# Patient Record
Sex: Male | Born: 1940
Health system: Southern US, Community
[De-identification: ages and names within clinical notes are randomized; demographics above are authoritative.]

## PROBLEM LIST (undated history)

## (undated) DIAGNOSIS — I10 Essential (primary) hypertension: Secondary | ICD-10-CM

## (undated) DIAGNOSIS — G629 Polyneuropathy, unspecified: Secondary | ICD-10-CM

## (undated) DIAGNOSIS — H5440 Blindness, one eye, unspecified eye: Secondary | ICD-10-CM

## (undated) DIAGNOSIS — E785 Hyperlipidemia, unspecified: Secondary | ICD-10-CM

## (undated) DIAGNOSIS — C4490 Unspecified malignant neoplasm of skin, unspecified: Secondary | ICD-10-CM

## (undated) DIAGNOSIS — L409 Psoriasis, unspecified: Secondary | ICD-10-CM

## (undated) HISTORY — DX: Unspecified malignant neoplasm of skin, unspecified: C44.90

## (undated) HISTORY — DX: Psoriasis, unspecified: L40.9

## (undated) HISTORY — DX: Blindness, one eye, unspecified eye: H54.40

## (undated) HISTORY — DX: Polyneuropathy, unspecified: G62.9

## (undated) HISTORY — PX: FACIAL COSMETIC SURGERY: SHX629

## (undated) HISTORY — PX: EYE SURGERY: SHX253

## (undated) HISTORY — DX: Hyperlipidemia, unspecified: E78.5

---

## 2003-06-26 ENCOUNTER — Encounter: Payer: Self-pay | Admitting: Emergency Medicine

## 2003-06-26 ENCOUNTER — Inpatient Hospital Stay (HOSPITAL_COMMUNITY): Admission: EM | Admit: 2003-06-26 | Discharge: 2003-06-28 | Payer: Self-pay | Admitting: Emergency Medicine

## 2003-06-28 ENCOUNTER — Encounter: Payer: Self-pay | Admitting: *Deleted

## 2015-02-25 DIAGNOSIS — H524 Presbyopia: Secondary | ICD-10-CM | POA: Diagnosis not present

## 2015-02-25 DIAGNOSIS — H5210 Myopia, unspecified eye: Secondary | ICD-10-CM | POA: Diagnosis not present

## 2016-03-07 DIAGNOSIS — H5210 Myopia, unspecified eye: Secondary | ICD-10-CM | POA: Diagnosis not present

## 2016-03-07 DIAGNOSIS — H524 Presbyopia: Secondary | ICD-10-CM | POA: Diagnosis not present

## 2017-01-01 DIAGNOSIS — E119 Type 2 diabetes mellitus without complications: Secondary | ICD-10-CM

## 2017-01-01 HISTORY — DX: Type 2 diabetes mellitus without complications: E11.9

## 2017-01-11 DIAGNOSIS — Z79899 Other long term (current) drug therapy: Secondary | ICD-10-CM | POA: Diagnosis not present

## 2017-01-11 DIAGNOSIS — L400 Psoriasis vulgaris: Secondary | ICD-10-CM | POA: Diagnosis not present

## 2017-01-26 DIAGNOSIS — E1165 Type 2 diabetes mellitus with hyperglycemia: Secondary | ICD-10-CM | POA: Diagnosis not present

## 2017-01-26 DIAGNOSIS — Z7984 Long term (current) use of oral hypoglycemic drugs: Secondary | ICD-10-CM | POA: Diagnosis not present

## 2017-02-07 DIAGNOSIS — E119 Type 2 diabetes mellitus without complications: Secondary | ICD-10-CM | POA: Diagnosis not present

## 2017-02-28 DIAGNOSIS — E1165 Type 2 diabetes mellitus with hyperglycemia: Secondary | ICD-10-CM | POA: Diagnosis not present

## 2017-04-04 DIAGNOSIS — Z7984 Long term (current) use of oral hypoglycemic drugs: Secondary | ICD-10-CM | POA: Diagnosis not present

## 2017-04-04 DIAGNOSIS — Z1211 Encounter for screening for malignant neoplasm of colon: Secondary | ICD-10-CM | POA: Diagnosis not present

## 2017-04-04 DIAGNOSIS — Z23 Encounter for immunization: Secondary | ICD-10-CM | POA: Diagnosis not present

## 2017-04-04 DIAGNOSIS — E1165 Type 2 diabetes mellitus with hyperglycemia: Secondary | ICD-10-CM | POA: Diagnosis not present

## 2017-04-04 DIAGNOSIS — Z0000 Encounter for general adult medical examination without abnormal findings: Secondary | ICD-10-CM | POA: Diagnosis not present

## 2017-05-11 DIAGNOSIS — Z7984 Long term (current) use of oral hypoglycemic drugs: Secondary | ICD-10-CM | POA: Diagnosis not present

## 2017-05-11 DIAGNOSIS — E1165 Type 2 diabetes mellitus with hyperglycemia: Secondary | ICD-10-CM | POA: Diagnosis not present

## 2017-05-25 DIAGNOSIS — Z1211 Encounter for screening for malignant neoplasm of colon: Secondary | ICD-10-CM | POA: Diagnosis not present

## 2017-05-31 DIAGNOSIS — E1165 Type 2 diabetes mellitus with hyperglycemia: Secondary | ICD-10-CM | POA: Diagnosis not present

## 2017-05-31 DIAGNOSIS — Z7984 Long term (current) use of oral hypoglycemic drugs: Secondary | ICD-10-CM | POA: Diagnosis not present

## 2017-10-17 DIAGNOSIS — E1165 Type 2 diabetes mellitus with hyperglycemia: Secondary | ICD-10-CM | POA: Diagnosis not present

## 2017-10-17 DIAGNOSIS — R351 Nocturia: Secondary | ICD-10-CM | POA: Diagnosis not present

## 2017-10-17 DIAGNOSIS — N401 Enlarged prostate with lower urinary tract symptoms: Secondary | ICD-10-CM | POA: Diagnosis not present

## 2017-10-17 DIAGNOSIS — E7800 Pure hypercholesterolemia, unspecified: Secondary | ICD-10-CM | POA: Diagnosis not present

## 2017-11-15 DIAGNOSIS — H2513 Age-related nuclear cataract, bilateral: Secondary | ICD-10-CM | POA: Diagnosis not present

## 2017-11-15 DIAGNOSIS — E119 Type 2 diabetes mellitus without complications: Secondary | ICD-10-CM | POA: Diagnosis not present

## 2017-11-15 DIAGNOSIS — H02234 Paralytic lagophthalmos left upper eyelid: Secondary | ICD-10-CM | POA: Diagnosis not present

## 2017-11-15 DIAGNOSIS — Z0100 Encounter for examination of eyes and vision without abnormal findings: Secondary | ICD-10-CM | POA: Diagnosis not present

## 2017-12-29 DIAGNOSIS — R0789 Other chest pain: Secondary | ICD-10-CM | POA: Diagnosis not present

## 2018-01-01 ENCOUNTER — Other Ambulatory Visit: Payer: Self-pay | Admitting: Family Medicine

## 2018-01-01 DIAGNOSIS — R0789 Other chest pain: Secondary | ICD-10-CM

## 2018-01-15 ENCOUNTER — Ambulatory Visit
Admission: RE | Admit: 2018-01-15 | Discharge: 2018-01-15 | Disposition: A | Discharge status: Home / Self Care | Payer: Medicare HMO | Source: Ambulatory Visit | Attending: Family Medicine | Admitting: Family Medicine

## 2018-01-15 DIAGNOSIS — R0789 Other chest pain: Secondary | ICD-10-CM

## 2018-01-15 DIAGNOSIS — R918 Other nonspecific abnormal finding of lung field: Secondary | ICD-10-CM | POA: Diagnosis not present

## 2018-01-15 MED ORDER — IOPAMIDOL (ISOVUE-300) INJECTION 61%
75.0000 mL | Freq: Once | INTRAVENOUS | Status: AC | PRN
Start: 2018-01-15 — End: 2018-01-15
  Administered 2018-01-15: 75 mL via INTRAVENOUS

## 2018-01-23 DIAGNOSIS — R0789 Other chest pain: Secondary | ICD-10-CM | POA: Diagnosis not present

## 2018-01-25 ENCOUNTER — Other Ambulatory Visit: Payer: Self-pay | Admitting: Family Medicine

## 2018-01-25 DIAGNOSIS — R1031 Right lower quadrant pain: Secondary | ICD-10-CM

## 2018-01-25 DIAGNOSIS — R1011 Right upper quadrant pain: Secondary | ICD-10-CM

## 2018-02-08 ENCOUNTER — Ambulatory Visit
Admission: RE | Admit: 2018-02-08 | Discharge: 2018-02-08 | Disposition: A | Discharge status: Home / Self Care | Payer: Medicare HMO | Source: Ambulatory Visit | Attending: Family Medicine | Admitting: Family Medicine

## 2018-02-08 DIAGNOSIS — R1011 Right upper quadrant pain: Secondary | ICD-10-CM

## 2018-02-08 DIAGNOSIS — K8020 Calculus of gallbladder without cholecystitis without obstruction: Secondary | ICD-10-CM | POA: Diagnosis not present

## 2018-02-08 DIAGNOSIS — R1031 Right lower quadrant pain: Secondary | ICD-10-CM

## 2018-02-28 DIAGNOSIS — K8020 Calculus of gallbladder without cholecystitis without obstruction: Secondary | ICD-10-CM | POA: Diagnosis not present

## 2018-03-16 DIAGNOSIS — R109 Unspecified abdominal pain: Secondary | ICD-10-CM | POA: Diagnosis not present

## 2018-04-06 DIAGNOSIS — E1165 Type 2 diabetes mellitus with hyperglycemia: Secondary | ICD-10-CM | POA: Diagnosis not present

## 2018-04-06 DIAGNOSIS — N401 Enlarged prostate with lower urinary tract symptoms: Secondary | ICD-10-CM | POA: Diagnosis not present

## 2018-04-06 DIAGNOSIS — L989 Disorder of the skin and subcutaneous tissue, unspecified: Secondary | ICD-10-CM | POA: Diagnosis not present

## 2018-04-06 DIAGNOSIS — Z0000 Encounter for general adult medical examination without abnormal findings: Secondary | ICD-10-CM | POA: Diagnosis not present

## 2018-04-06 DIAGNOSIS — Z23 Encounter for immunization: Secondary | ICD-10-CM | POA: Diagnosis not present

## 2018-04-06 DIAGNOSIS — E7800 Pure hypercholesterolemia, unspecified: Secondary | ICD-10-CM | POA: Diagnosis not present

## 2018-04-06 DIAGNOSIS — R0789 Other chest pain: Secondary | ICD-10-CM | POA: Diagnosis not present

## 2018-04-06 DIAGNOSIS — Z1211 Encounter for screening for malignant neoplasm of colon: Secondary | ICD-10-CM | POA: Diagnosis not present

## 2018-06-06 DIAGNOSIS — R399 Unspecified symptoms and signs involving the genitourinary system: Secondary | ICD-10-CM | POA: Diagnosis not present

## 2018-06-06 DIAGNOSIS — R319 Hematuria, unspecified: Secondary | ICD-10-CM | POA: Diagnosis not present

## 2018-06-06 DIAGNOSIS — N390 Urinary tract infection, site not specified: Secondary | ICD-10-CM | POA: Diagnosis not present

## 2018-06-12 DIAGNOSIS — C44321 Squamous cell carcinoma of skin of nose: Secondary | ICD-10-CM | POA: Diagnosis not present

## 2018-06-12 DIAGNOSIS — L570 Actinic keratosis: Secondary | ICD-10-CM | POA: Diagnosis not present

## 2018-06-12 DIAGNOSIS — X32XXXA Exposure to sunlight, initial encounter: Secondary | ICD-10-CM | POA: Diagnosis not present

## 2018-07-05 DIAGNOSIS — N401 Enlarged prostate with lower urinary tract symptoms: Secondary | ICD-10-CM | POA: Diagnosis not present

## 2018-07-05 DIAGNOSIS — E7800 Pure hypercholesterolemia, unspecified: Secondary | ICD-10-CM | POA: Diagnosis not present

## 2018-07-05 DIAGNOSIS — E1165 Type 2 diabetes mellitus with hyperglycemia: Secondary | ICD-10-CM | POA: Diagnosis not present

## 2018-07-10 DIAGNOSIS — Z08 Encounter for follow-up examination after completed treatment for malignant neoplasm: Secondary | ICD-10-CM | POA: Diagnosis not present

## 2018-07-10 DIAGNOSIS — Z85828 Personal history of other malignant neoplasm of skin: Secondary | ICD-10-CM | POA: Diagnosis not present

## 2019-04-11 ENCOUNTER — Other Ambulatory Visit: Payer: Self-pay | Admitting: Family Medicine

## 2019-04-11 DIAGNOSIS — R911 Solitary pulmonary nodule: Secondary | ICD-10-CM

## 2019-05-01 ENCOUNTER — Other Ambulatory Visit: Payer: Self-pay

## 2019-05-01 ENCOUNTER — Ambulatory Visit
Admission: RE | Admit: 2019-05-01 | Discharge: 2019-05-01 | Disposition: A | Discharge status: Home / Self Care | Payer: Medicare HMO | Source: Ambulatory Visit | Attending: Family Medicine | Admitting: Family Medicine

## 2019-05-01 DIAGNOSIS — R911 Solitary pulmonary nodule: Secondary | ICD-10-CM

## 2019-05-01 DIAGNOSIS — R918 Other nonspecific abnormal finding of lung field: Secondary | ICD-10-CM | POA: Diagnosis not present

## 2019-05-15 DIAGNOSIS — N401 Enlarged prostate with lower urinary tract symptoms: Secondary | ICD-10-CM | POA: Diagnosis not present

## 2019-05-15 DIAGNOSIS — Z7984 Long term (current) use of oral hypoglycemic drugs: Secondary | ICD-10-CM | POA: Diagnosis not present

## 2019-05-15 DIAGNOSIS — Z0000 Encounter for general adult medical examination without abnormal findings: Secondary | ICD-10-CM | POA: Diagnosis not present

## 2019-05-15 DIAGNOSIS — E1165 Type 2 diabetes mellitus with hyperglycemia: Secondary | ICD-10-CM | POA: Diagnosis not present

## 2019-05-15 DIAGNOSIS — E7800 Pure hypercholesterolemia, unspecified: Secondary | ICD-10-CM | POA: Diagnosis not present

## 2020-01-14 ENCOUNTER — Other Ambulatory Visit: Payer: Self-pay | Admitting: Family Medicine

## 2020-01-14 DIAGNOSIS — R918 Other nonspecific abnormal finding of lung field: Secondary | ICD-10-CM

## 2020-05-20 DIAGNOSIS — N401 Enlarged prostate with lower urinary tract symptoms: Secondary | ICD-10-CM | POA: Diagnosis not present

## 2020-05-20 DIAGNOSIS — Z125 Encounter for screening for malignant neoplasm of prostate: Secondary | ICD-10-CM | POA: Diagnosis not present

## 2020-05-20 DIAGNOSIS — Z1389 Encounter for screening for other disorder: Secondary | ICD-10-CM | POA: Diagnosis not present

## 2020-05-20 DIAGNOSIS — Z0000 Encounter for general adult medical examination without abnormal findings: Secondary | ICD-10-CM | POA: Diagnosis not present

## 2020-05-20 DIAGNOSIS — I10 Essential (primary) hypertension: Secondary | ICD-10-CM | POA: Diagnosis not present

## 2020-05-20 DIAGNOSIS — E7800 Pure hypercholesterolemia, unspecified: Secondary | ICD-10-CM | POA: Diagnosis not present

## 2020-05-20 DIAGNOSIS — E1165 Type 2 diabetes mellitus with hyperglycemia: Secondary | ICD-10-CM | POA: Diagnosis not present

## 2020-05-20 DIAGNOSIS — R911 Solitary pulmonary nodule: Secondary | ICD-10-CM | POA: Diagnosis not present

## 2020-05-25 ENCOUNTER — Other Ambulatory Visit: Payer: Self-pay | Admitting: Family Medicine

## 2020-05-25 DIAGNOSIS — R918 Other nonspecific abnormal finding of lung field: Secondary | ICD-10-CM

## 2020-05-27 DIAGNOSIS — E119 Type 2 diabetes mellitus without complications: Secondary | ICD-10-CM | POA: Diagnosis not present

## 2020-06-18 DIAGNOSIS — Z0100 Encounter for examination of eyes and vision without abnormal findings: Secondary | ICD-10-CM | POA: Diagnosis not present

## 2020-07-02 ENCOUNTER — Other Ambulatory Visit: Payer: Medicare HMO

## 2020-09-22 DIAGNOSIS — E1165 Type 2 diabetes mellitus with hyperglycemia: Secondary | ICD-10-CM | POA: Diagnosis not present

## 2020-09-22 DIAGNOSIS — E7800 Pure hypercholesterolemia, unspecified: Secondary | ICD-10-CM | POA: Diagnosis not present

## 2020-11-20 DIAGNOSIS — I10 Essential (primary) hypertension: Secondary | ICD-10-CM | POA: Diagnosis not present

## 2020-11-20 DIAGNOSIS — L989 Disorder of the skin and subcutaneous tissue, unspecified: Secondary | ICD-10-CM | POA: Diagnosis not present

## 2020-11-20 DIAGNOSIS — E1165 Type 2 diabetes mellitus with hyperglycemia: Secondary | ICD-10-CM | POA: Diagnosis not present

## 2020-11-20 DIAGNOSIS — E7800 Pure hypercholesterolemia, unspecified: Secondary | ICD-10-CM | POA: Diagnosis not present

## 2020-11-20 DIAGNOSIS — N401 Enlarged prostate with lower urinary tract symptoms: Secondary | ICD-10-CM | POA: Diagnosis not present

## 2020-11-20 DIAGNOSIS — R0600 Dyspnea, unspecified: Secondary | ICD-10-CM | POA: Diagnosis not present

## 2020-11-20 DIAGNOSIS — R911 Solitary pulmonary nodule: Secondary | ICD-10-CM | POA: Diagnosis not present

## 2020-12-01 DIAGNOSIS — C44529 Squamous cell carcinoma of skin of other part of trunk: Secondary | ICD-10-CM | POA: Diagnosis not present

## 2020-12-01 DIAGNOSIS — C44319 Basal cell carcinoma of skin of other parts of face: Secondary | ICD-10-CM | POA: Diagnosis not present

## 2020-12-12 ENCOUNTER — Other Ambulatory Visit: Payer: Self-pay

## 2020-12-12 ENCOUNTER — Ambulatory Visit
Admission: RE | Admit: 2020-12-12 | Discharge: 2020-12-12 | Disposition: A | Discharge status: Home / Self Care | Payer: Medicare HMO | Source: Ambulatory Visit | Attending: Family Medicine | Admitting: Family Medicine

## 2020-12-12 DIAGNOSIS — S2242XA Multiple fractures of ribs, left side, initial encounter for closed fracture: Secondary | ICD-10-CM | POA: Diagnosis not present

## 2020-12-12 DIAGNOSIS — K449 Diaphragmatic hernia without obstruction or gangrene: Secondary | ICD-10-CM | POA: Diagnosis not present

## 2020-12-12 DIAGNOSIS — I2510 Atherosclerotic heart disease of native coronary artery without angina pectoris: Secondary | ICD-10-CM | POA: Diagnosis not present

## 2020-12-12 DIAGNOSIS — R918 Other nonspecific abnormal finding of lung field: Secondary | ICD-10-CM

## 2020-12-12 DIAGNOSIS — K8080 Other cholelithiasis without obstruction: Secondary | ICD-10-CM | POA: Diagnosis not present
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
## 2020-12-16 ENCOUNTER — Other Ambulatory Visit: Payer: Self-pay

## 2020-12-22 ENCOUNTER — Telehealth: Payer: Self-pay | Admitting: Pulmonary Disease

## 2020-12-23 ENCOUNTER — Telehealth: Payer: Self-pay | Admitting: *Deleted

## 2020-12-23 NOTE — Telephone Encounter (Signed)
I received a call back from Mr. Tony Ramirez family.  They state they take care of his appts.  I updated that Dr. Juline Patch office will be calling with an appt.  I then notified Dr. Valeta Harms and his staff of the family members phone number to call with an appt.

## 2020-12-23 NOTE — Telephone Encounter (Signed)
I followed up on Tony Ramirez schedule to see Tony Ramirez and did not see any appts.  I called Tony Ramirez but was unable to reach him. I did leave vm message with my name and phone number to call.

## 2020-12-24 NOTE — Telephone Encounter (Signed)
Called family phone number [phone] and l/m on vm to call back to schedule consult with Dr. Valeta Harms -pr

## 2020-12-25 ENCOUNTER — Ambulatory Visit: Payer: Medicare HMO | Admitting: Cardiovascular Disease

## 2020-12-25 ENCOUNTER — Telehealth: Payer: Self-pay | Admitting: *Deleted

## 2020-12-25 ENCOUNTER — Encounter: Payer: Self-pay | Admitting: Cardiovascular Disease

## 2020-12-25 ENCOUNTER — Other Ambulatory Visit: Payer: Self-pay

## 2020-12-25 VITALS — BP 140/90 | HR 91 | Ht 66.0 in | Wt 180.0 lb

## 2020-12-25 DIAGNOSIS — R0600 Dyspnea, unspecified: Secondary | ICD-10-CM | POA: Diagnosis not present

## 2020-12-25 DIAGNOSIS — R072 Precordial pain: Secondary | ICD-10-CM | POA: Diagnosis not present

## 2020-12-25 NOTE — Telephone Encounter (Signed)
Consult was scheduled for 05/10.  BI wanted to see the pt next week.   This appt needs to be changed.  I have called the pts family and LM on VM so when they call back this appt can be changed.

## 2020-12-25 NOTE — Patient Instructions (Addendum)
Medication Instructions:  *If you need a refill on your cardiac medications before your next appointment, please call your pharmacy*  Lab Work: If you have labs (blood work) drawn today and your tests are completely normal, you will receive your results only by: Marland Kitchen MyChart Message (if you have MyChart) OR . A paper copy in the mail If you have any lab test that is abnormal or we need to change your treatment, we will call you to review the results.  Testing/Procedures: Your physician has requested that you have an echocardiogram. Echocardiography is a painless test that uses sound waves to create images of your heart. It provides your doctor with information about the size and shape of your heart and how well your heart's chambers and valves are working. This procedure takes approximately one hour. There are no restrictions for this procedure.  Your physician has requested that you have a lexiscan myoview. For further information please visit [email or web address]. Please follow instruction sheet, as given.  Follow-Up: At [facility], you and your health needs are our priority.  As part of our continuing mission to provide you with exceptional heart care, we have created designated Provider Care Teams.  These Care Teams include your primary Cardiologist (physician) and Advanced Practice Providers (APPs -  Physician Assistants and Nurse Practitioners) who all work together to provide you with the care you need, when you need it.  We recommend signing up for the patient portal called "MyChart".  Sign up information is provided on this After Visit Summary.  MyChart is used to connect with patients for Virtual Visits (Telemedicine).  Patients are able to view lab/test results, encounter notes, upcoming appointments, etc.  Non-urgent messages can be sent to your provider as well.   To learn more about what you can do with MyChart, go to [email or web address].    Your next appointment:   As  needed  The format for your next appointment:   In Person  Provider:   You may see Dr. Johnsie Cancel or one of the following Advanced Practice Providers on your designated Care Team:    Kathyrn Drown, NP

## 2020-12-25 NOTE — Telephone Encounter (Signed)
Patient scheduled with Dr. Valeta Harms on 01/01/2021-pr

## 2020-12-31 ENCOUNTER — Telehealth (HOSPITAL_COMMUNITY): Payer: Self-pay | Admitting: Cardiovascular Disease

## 2020-12-31 NOTE — Telephone Encounter (Signed)
Patient called and cancelled echocardiogram and Myoview for reasons below:  12/31/2020 8:51 AM By: Armando Gang Cancel Rsn: Patient (Per daughter, patient does not feel comfortable having these tests. Wants to cancel all for now.)  Orders will be removed from the WQ's and if patient calls back to reschedule we will reinstate the order. Thank you.

## 2021-01-01 ENCOUNTER — Ambulatory Visit: Payer: Medicare HMO | Admitting: Pulmonary Disease

## 2021-01-01 ENCOUNTER — Encounter: Payer: Self-pay | Admitting: Pulmonary Disease

## 2021-01-01 ENCOUNTER — Other Ambulatory Visit: Payer: Self-pay

## 2021-01-01 VITALS — BP 144/80 | HR 92 | Temp 98.0°F | Ht 66.0 in | Wt 181.0 lb

## 2021-01-01 DIAGNOSIS — R911 Solitary pulmonary nodule: Secondary | ICD-10-CM | POA: Diagnosis not present

## 2021-01-01 DIAGNOSIS — Z789 Other specified health status: Secondary | ICD-10-CM

## 2021-01-01 NOTE — Patient Instructions (Signed)
Thank you for visiting Dr. Valeta Harms at [facility]. Today we recommend the following:  Orders Placed This Encounter  Procedures  . CT Chest Wo Contrast   Ct chest in 6 months. Follow up with me afterwards.   Return in about 6 months (around 07/03/2021) for w/ Dr. Valeta Harms.    Please do your part to reduce the spread of COVID-19.
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
## 2021-01-12 DIAGNOSIS — Z85828 Personal history of other malignant neoplasm of skin: Secondary | ICD-10-CM | POA: Diagnosis not present

## 2021-01-12 DIAGNOSIS — Z08 Encounter for follow-up examination after completed treatment for malignant neoplasm: Secondary | ICD-10-CM | POA: Diagnosis not present

## 2021-01-12 DIAGNOSIS — L570 Actinic keratosis: Secondary | ICD-10-CM | POA: Diagnosis not present

## 2021-01-12 DIAGNOSIS — X32XXXD Exposure to sunlight, subsequent encounter: Secondary | ICD-10-CM | POA: Diagnosis not present

## 2021-01-26 ENCOUNTER — Other Ambulatory Visit (HOSPITAL_COMMUNITY): Payer: Medicare HMO

## 2021-01-27 ENCOUNTER — Other Ambulatory Visit (HOSPITAL_COMMUNITY): Payer: Medicare HMO

## 2021-01-27 ENCOUNTER — Encounter (HOSPITAL_COMMUNITY): Payer: Medicare HMO

## 2021-02-09 ENCOUNTER — Institutional Professional Consult (permissible substitution): Payer: Medicare HMO | Admitting: Pulmonary Disease

## 2021-06-16 ENCOUNTER — Telehealth: Payer: Self-pay | Admitting: Pulmonary Disease

## 2021-06-16 NOTE — Telephone Encounter (Signed)
Pts daughter stated that the pt did not want to have the CT scan done at this time.  She is going to speak with the pt about doing the CT.  She wanted to let BI know that the pt is not wanting it done yet.  Will forward to BI to make him aware.   From last OV on 01/01/21  Plan: Recommend 30-monthnoncontrasted CT follow-up. We discussed various options to include referral to thoracic surgery, PET scan imaging. We will plan for repeat CT if this nodule is enlarging then we will obtain a PET scan. He is not interested in pursuing surgery.  He has had several surgeries in his lifetime. He is also not very interested in considering radiation treatment but would prefer to see what this nodule does before making any life altering decisions.

## 2021-06-18 NOTE — Telephone Encounter (Signed)
Please schedule office follow up with me in 3-6 months if patient is interested   I have called the pts daughter, Patrici Ranks and LM on VM for her to call the office back if the pt changes his mind about the CT scan or if he wants to schedule this OV and we can do that as well.

## 2021-06-22 ENCOUNTER — Other Ambulatory Visit: Payer: Medicare HMO

## 2021-09-13 DIAGNOSIS — E1169 Type 2 diabetes mellitus with other specified complication: Secondary | ICD-10-CM | POA: Diagnosis not present

## 2021-09-13 DIAGNOSIS — E7800 Pure hypercholesterolemia, unspecified: Secondary | ICD-10-CM | POA: Diagnosis not present

## 2021-09-13 DIAGNOSIS — I10 Essential (primary) hypertension: Secondary | ICD-10-CM | POA: Diagnosis not present

## 2021-09-21 DIAGNOSIS — Z7984 Long term (current) use of oral hypoglycemic drugs: Secondary | ICD-10-CM | POA: Diagnosis not present

## 2021-09-21 DIAGNOSIS — E1169 Type 2 diabetes mellitus with other specified complication: Secondary | ICD-10-CM | POA: Diagnosis not present

## 2021-09-21 DIAGNOSIS — E7800 Pure hypercholesterolemia, unspecified: Secondary | ICD-10-CM | POA: Diagnosis not present

## 2021-09-21 DIAGNOSIS — I10 Essential (primary) hypertension: Secondary | ICD-10-CM | POA: Diagnosis not present

## 2022-01-17 DIAGNOSIS — M25551 Pain in right hip: Secondary | ICD-10-CM | POA: Diagnosis not present

## 2022-01-17 DIAGNOSIS — E1169 Type 2 diabetes mellitus with other specified complication: Secondary | ICD-10-CM | POA: Diagnosis not present

## 2022-01-17 DIAGNOSIS — R413 Other amnesia: Secondary | ICD-10-CM | POA: Diagnosis not present

## 2022-01-17 DIAGNOSIS — I10 Essential (primary) hypertension: Secondary | ICD-10-CM | POA: Diagnosis not present

## 2022-01-17 DIAGNOSIS — Z113 Encounter for screening for infections with a predominantly sexual mode of transmission: Secondary | ICD-10-CM | POA: Diagnosis not present

## 2022-01-17 DIAGNOSIS — E7800 Pure hypercholesterolemia, unspecified: Secondary | ICD-10-CM | POA: Diagnosis not present

## 2022-01-17 DIAGNOSIS — R2689 Other abnormalities of gait and mobility: Secondary | ICD-10-CM | POA: Diagnosis not present

## 2022-01-31 ENCOUNTER — Other Ambulatory Visit: Payer: Self-pay | Admitting: Family Medicine

## 2022-01-31 DIAGNOSIS — R413 Other amnesia: Secondary | ICD-10-CM

## 2022-02-08 ENCOUNTER — Ambulatory Visit
Admission: RE | Admit: 2022-02-08 | Discharge: 2022-02-08 | Disposition: A | Discharge status: Home / Self Care | Payer: Medicare HMO | Source: Ambulatory Visit | Attending: Family Medicine | Admitting: Family Medicine

## 2022-02-08 DIAGNOSIS — R413 Other amnesia: Secondary | ICD-10-CM | POA: Diagnosis not present

## 2022-02-08 DIAGNOSIS — R531 Weakness: Secondary | ICD-10-CM | POA: Diagnosis not present

## 2022-08-02 DIAGNOSIS — E119 Type 2 diabetes mellitus without complications: Secondary | ICD-10-CM | POA: Diagnosis not present

## 2022-08-02 DIAGNOSIS — H2511 Age-related nuclear cataract, right eye: Secondary | ICD-10-CM | POA: Diagnosis not present

## 2022-09-21 DIAGNOSIS — Z0100 Encounter for examination of eyes and vision without abnormal findings: Secondary | ICD-10-CM | POA: Diagnosis not present

## 2022-12-16 ENCOUNTER — Other Ambulatory Visit: Payer: Self-pay | Admitting: Family Medicine

## 2022-12-16 DIAGNOSIS — E7800 Pure hypercholesterolemia, unspecified: Secondary | ICD-10-CM | POA: Diagnosis not present

## 2022-12-16 DIAGNOSIS — R911 Solitary pulmonary nodule: Secondary | ICD-10-CM | POA: Diagnosis not present

## 2022-12-16 DIAGNOSIS — N401 Enlarged prostate with lower urinary tract symptoms: Secondary | ICD-10-CM | POA: Diagnosis not present

## 2022-12-16 DIAGNOSIS — R413 Other amnesia: Secondary | ICD-10-CM | POA: Diagnosis not present

## 2022-12-16 DIAGNOSIS — I10 Essential (primary) hypertension: Secondary | ICD-10-CM | POA: Diagnosis not present

## 2022-12-16 DIAGNOSIS — E1165 Type 2 diabetes mellitus with hyperglycemia: Secondary | ICD-10-CM | POA: Diagnosis not present

## 2023-01-23 ENCOUNTER — Ambulatory Visit
Admission: RE | Admit: 2023-01-23 | Discharge: 2023-01-23 | Disposition: A | Discharge status: Home / Self Care | Payer: Medicare HMO | Source: Ambulatory Visit | Attending: Family Medicine | Admitting: Family Medicine

## 2023-01-23 DIAGNOSIS — R911 Solitary pulmonary nodule: Secondary | ICD-10-CM

## 2023-01-23 DIAGNOSIS — I700 Atherosclerosis of aorta: Secondary | ICD-10-CM | POA: Diagnosis not present

## 2023-03-30 DIAGNOSIS — R911 Solitary pulmonary nodule: Secondary | ICD-10-CM | POA: Diagnosis not present

## 2023-03-30 DIAGNOSIS — I700 Atherosclerosis of aorta: Secondary | ICD-10-CM | POA: Diagnosis not present

## 2023-03-30 DIAGNOSIS — E7800 Pure hypercholesterolemia, unspecified: Secondary | ICD-10-CM | POA: Diagnosis not present

## 2023-03-30 DIAGNOSIS — E1165 Type 2 diabetes mellitus with hyperglycemia: Secondary | ICD-10-CM | POA: Diagnosis not present

## 2023-03-30 DIAGNOSIS — R413 Other amnesia: Secondary | ICD-10-CM | POA: Diagnosis not present

## 2023-03-30 DIAGNOSIS — N401 Enlarged prostate with lower urinary tract symptoms: Secondary | ICD-10-CM | POA: Diagnosis not present

## 2023-03-30 DIAGNOSIS — E1136 Type 2 diabetes mellitus with diabetic cataract: Secondary | ICD-10-CM | POA: Diagnosis not present

## 2023-03-30 DIAGNOSIS — I10 Essential (primary) hypertension: Secondary | ICD-10-CM | POA: Diagnosis not present

## 2023-03-30 DIAGNOSIS — R944 Abnormal results of kidney function studies: Secondary | ICD-10-CM | POA: Diagnosis not present
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
## 2024-01-23 DIAGNOSIS — H5442A5 Blindness left eye category 5, normal vision right eye: Secondary | ICD-10-CM | POA: Diagnosis not present

## 2024-01-23 DIAGNOSIS — E119 Type 2 diabetes mellitus without complications: Secondary | ICD-10-CM | POA: Diagnosis not present

## 2024-03-26 DIAGNOSIS — E1165 Type 2 diabetes mellitus with hyperglycemia: Secondary | ICD-10-CM | POA: Diagnosis not present

## 2024-03-26 DIAGNOSIS — L409 Psoriasis, unspecified: Secondary | ICD-10-CM | POA: Diagnosis not present

## 2024-03-26 DIAGNOSIS — G2581 Restless legs syndrome: Secondary | ICD-10-CM | POA: Diagnosis not present

## 2024-04-24 DIAGNOSIS — H269 Unspecified cataract: Secondary | ICD-10-CM | POA: Diagnosis not present

## 2024-04-24 DIAGNOSIS — Z1331 Encounter for screening for depression: Secondary | ICD-10-CM | POA: Diagnosis not present

## 2024-04-24 DIAGNOSIS — I10 Essential (primary) hypertension: Secondary | ICD-10-CM | POA: Diagnosis not present

## 2024-04-24 DIAGNOSIS — N401 Enlarged prostate with lower urinary tract symptoms: Secondary | ICD-10-CM | POA: Diagnosis not present

## 2024-04-24 DIAGNOSIS — E7800 Pure hypercholesterolemia, unspecified: Secondary | ICD-10-CM | POA: Diagnosis not present

## 2024-04-24 DIAGNOSIS — E1165 Type 2 diabetes mellitus with hyperglycemia: Secondary | ICD-10-CM | POA: Diagnosis not present

## 2024-04-24 DIAGNOSIS — Z0000 Encounter for general adult medical examination without abnormal findings: Secondary | ICD-10-CM | POA: Diagnosis not present

## 2024-07-02 DIAGNOSIS — C44329 Squamous cell carcinoma of skin of other parts of face: Secondary | ICD-10-CM | POA: Diagnosis not present

## 2024-07-30 DIAGNOSIS — C44329 Squamous cell carcinoma of skin of other parts of face: Secondary | ICD-10-CM | POA: Diagnosis not present

## 2024-08-02 ENCOUNTER — Other Ambulatory Visit (HOSPITAL_COMMUNITY): Payer: Self-pay | Admitting: Optometry

## 2024-08-02 ENCOUNTER — Other Ambulatory Visit (HOSPITAL_BASED_OUTPATIENT_CLINIC_OR_DEPARTMENT_OTHER): Payer: Self-pay | Admitting: Optometry

## 2024-08-02 ENCOUNTER — Ambulatory Visit (HOSPITAL_BASED_OUTPATIENT_CLINIC_OR_DEPARTMENT_OTHER)
Admission: RE | Admit: 2024-08-02 | Discharge: 2024-08-02 | Disposition: A | Discharge status: Home / Self Care | Source: Ambulatory Visit | Attending: Optometry | Admitting: Optometry

## 2024-08-02 DIAGNOSIS — H05022 Osteomyelitis of left orbit: Secondary | ICD-10-CM | POA: Insufficient documentation

## 2024-08-02 DIAGNOSIS — H05012 Cellulitis of left orbit: Secondary | ICD-10-CM

## 2024-08-02 DIAGNOSIS — J3489 Other specified disorders of nose and nasal sinuses: Secondary | ICD-10-CM | POA: Diagnosis not present

## 2024-08-02 MED ORDER — IOHEXOL 300 MG/ML  SOLN
75.0000 mL | Freq: Once | INTRAMUSCULAR | Status: AC | PRN
  Administered 2024-08-02: 75 mL via INTRAVENOUS

## 2024-08-05 DIAGNOSIS — E119 Type 2 diabetes mellitus without complications: Secondary | ICD-10-CM | POA: Diagnosis not present

## 2024-08-05 DIAGNOSIS — H05012 Cellulitis of left orbit: Secondary | ICD-10-CM | POA: Diagnosis not present

## 2024-08-05 DIAGNOSIS — H2511 Age-related nuclear cataract, right eye: Secondary | ICD-10-CM | POA: Diagnosis not present

## 2024-08-06 DIAGNOSIS — M952 Other acquired deformity of head: Secondary | ICD-10-CM | POA: Diagnosis not present

## 2024-08-06 DIAGNOSIS — C44329 Squamous cell carcinoma of skin of other parts of face: Secondary | ICD-10-CM | POA: Diagnosis not present

## 2024-08-06 DIAGNOSIS — Z87828 Personal history of other (healed) physical injury and trauma: Secondary | ICD-10-CM | POA: Diagnosis not present

## 2024-08-06 DIAGNOSIS — G510 Bell's palsy: Secondary | ICD-10-CM | POA: Diagnosis not present

## 2024-08-07 ENCOUNTER — Other Ambulatory Visit (HOSPITAL_COMMUNITY): Payer: Self-pay | Admitting: Otolaryngology

## 2024-08-07 ENCOUNTER — Ambulatory Visit (HOSPITAL_COMMUNITY)
Admission: RE | Admit: 2024-08-07 | Discharge: 2024-08-07 | Disposition: A | Discharge status: Home / Self Care | Source: Ambulatory Visit | Attending: Otolaryngology | Admitting: Otolaryngology

## 2024-08-07 DIAGNOSIS — C44329 Squamous cell carcinoma of skin of other parts of face: Secondary | ICD-10-CM | POA: Diagnosis not present

## 2024-08-07 DIAGNOSIS — S0232XA Fracture of orbital floor, left side, initial encounter for closed fracture: Secondary | ICD-10-CM | POA: Diagnosis not present

## 2024-08-07 DIAGNOSIS — R220 Localized swelling, mass and lump, head: Secondary | ICD-10-CM | POA: Diagnosis not present

## 2024-08-07 DIAGNOSIS — S02401A Maxillary fracture, unspecified, initial encounter for closed fracture: Secondary | ICD-10-CM | POA: Diagnosis not present

## 2024-08-07 DIAGNOSIS — M47812 Spondylosis without myelopathy or radiculopathy, cervical region: Secondary | ICD-10-CM | POA: Diagnosis not present

## 2024-08-07 MED ORDER — SODIUM CHLORIDE (PF) 0.9 % IJ SOLN
INTRAMUSCULAR | Status: AC
  Filled 2024-08-07: qty 50

## 2024-08-07 MED ORDER — IOHEXOL 300 MG/ML  SOLN
75.0000 mL | Freq: Once | INTRAMUSCULAR | Status: AC | PRN
  Administered 2024-08-07: 75 mL via INTRAVENOUS

## 2024-08-12 DIAGNOSIS — H05012 Cellulitis of left orbit: Secondary | ICD-10-CM | POA: Diagnosis not present

## 2024-08-12 DIAGNOSIS — H2511 Age-related nuclear cataract, right eye: Secondary | ICD-10-CM | POA: Diagnosis not present

## 2024-08-16 ENCOUNTER — Other Ambulatory Visit: Payer: Self-pay | Admitting: Otolaryngology

## 2024-08-16 NOTE — Telephone Encounter (Signed)
 Pts grandson would like to know if it will be ok to push his post op out a week further due to him having to work at the time of the appointment.Please call him at [phone].

## 2024-08-16 NOTE — Progress Notes (Signed)
 Pre-op surgical orders have been requested.

## 2024-08-16 NOTE — Progress Notes (Signed)
 Surgical Instructions   Your procedure is scheduled on Friday, November 21st. Report to [facility] Main Entrance A at 10:00 A.M., then check in with the Admitting office. Any questions or running late day of surgery: call [phone]  Questions prior to your surgery date: call [phone], Monday-Friday, 8am-4pm. If you experience any cold or flu symptoms such as cough, fever, chills, shortness of breath, etc. between now and your scheduled surgery, please notify us  at the above number.     Remember:  Do not eat after midnight the night before your surgery   You may drink clear liquids until 9:00 the morning of your surgery.   Clear liquids allowed are: Water, Non-Citrus Juices (without pulp), Carbonated Beverages, Clear Tea (no milk, honey, etc.), Black Coffee Only (NO MILK, CREAM OR POWDERED CREAMER of any kind), and Gatorade.    Take these medicines the morning of surgery with A SIP OF WATER  atorvastatin (LIPITOR)   One week prior to surgery, STOP taking any Aspirin (unless otherwise instructed by your surgeon) Aleve, Naproxen, Ibuprofen, Motrin, Advil, Goody's, BC's, all herbal medications, fish oil, and non-prescription vitamins.  WHAT DO I DO ABOUT MY DIABETES MEDICATION?   Do not take oral diabetes medicines glimepiride (AMARYL)  the morning of surgery.  Last dose: Thursday, November 20th.  Per your physician's instruction, HOLD your MOUNJARO injection AS OF TODAY .  HOW TO MANAGE YOUR DIABETES BEFORE AND AFTER SURGERY  Why is it important to control my blood sugar before and after surgery? Improving blood sugar levels before and after surgery helps healing and can limit problems. A way of improving blood sugar control is eating a healthy diet by:  Eating less sugar and carbohydrates  Increasing activity/exercise  Talking with your doctor about reaching your blood sugar goals High blood sugars (greater than 180 mg/dL) can raise your risk of infections and slow your  recovery, so you will need to focus on controlling your diabetes during the weeks before surgery. Make sure that the doctor who takes care of your diabetes knows about your planned surgery including the date and location.  How do I manage my blood sugar before surgery? Check your blood sugar at least 4 times a day, starting 2 days before surgery, to make sure that the level is not too high or low.  Check your blood sugar the morning of your surgery when you wake up and every 2 hours until you get to the Short Stay unit.  If your blood sugar is less than 70 mg/dL, you will need to treat for low blood sugar: Do not take insulin. Treat a low blood sugar (less than 70 mg/dL) with  cup of clear juice (cranberry or apple), 4 glucose tablets, OR glucose gel. Recheck blood sugar in 15 minutes after treatment (to make sure it is greater than 70 mg/dL). If your blood sugar is not greater than 70 mg/dL on recheck, call [phone] for further instructions. Report your blood sugar to the short stay nurse when you get to Short Stay.  If you are admitted to the hospital after surgery: Your blood sugar will be checked by the staff and you will probably be given insulin after surgery (instead of oral diabetes medicines) to make sure you have good blood sugar levels. The goal for blood sugar control after surgery is 80-180 mg/dL.                     Do NOT Smoke (Tobacco/Vaping) for 24 hours  prior to your procedure.  If you use a CPAP at night, you may bring your mask/headgear for your overnight stay.   You will be asked to remove any contacts, glasses, piercing's, hearing aid's, dentures/partials prior to surgery. Please bring cases for these items if needed.    Patients discharged the day of surgery will not be allowed to drive home, and someone needs to stay with them for 24 hours.  SURGICAL WAITING ROOM VISITATION Patients may have no more than 2 support people in the waiting area - these visitors  may rotate.   Pre-op nurse will coordinate an appropriate time for 1 ADULT support person, who may not rotate, to accompany patient in pre-op.  Children under the age of 78 must have an adult with them who is not the patient and must remain in the main waiting area with an adult.  If the patient needs to stay at the hospital during part of their recovery, the visitor guidelines for inpatient rooms apply.  Please refer to the [facility] website for the visitor guidelines for any additional information.   If you received a COVID test during your pre-op visit  it is requested that you wear a mask when out in public, stay away from anyone that may not be feeling well and notify your surgeon if you develop symptoms. If you have been in contact with anyone that has tested positive in the last 10 days please notify you surgeon.      Pre-operative CHG Bathing Instructions   You can play a key role in reducing the risk of infection after surgery. Your skin needs to be as free of germs as possible. You can reduce the number of germs on your skin by washing with CHG (chlorhexidine gluconate) soap before surgery. CHG is an antiseptic soap that kills germs and continues to kill germs even after washing.   DO NOT use if you have an allergy to chlorhexidine/CHG or antibacterial soaps. If your skin becomes reddened or irritated, stop using the CHG and notify one of our RNs at [phone].              TAKE A SHOWER THE NIGHT BEFORE SURGERY   Please keep in mind the following:  DO NOT shave, including legs and underarms, 48 hours prior to surgery.   You may shave your face before/day of surgery.  Place clean sheets on your bed the night before surgery Use a clean washcloth (not used since being washed) for shower. DO NOT sleep with pet's night before surgery.  CHG Shower Instructions:  Wash your face and private area with normal soap. If you choose to wash your hair, wash first with your normal  shampoo.  After you use shampoo/soap, rinse your hair and body thoroughly to remove shampoo/soap residue.  Turn the water OFF and apply half the bottle of CHG soap to a CLEAN washcloth.  Apply CHG soap ONLY FROM YOUR NECK DOWN TO YOUR TOES (washing for 3-5 minutes)  DO NOT use CHG soap on face, private areas, open wounds, or sores.  Pay special attention to the area where your surgery is being performed.  If you are having back surgery, having someone wash your back for you may be helpful. Wait 2 minutes after CHG soap is applied, then you may rinse off the CHG soap.  Pat dry with a clean towel  Put on clean pajamas    Additional instructions for the day of surgery: If you choose, you may  shower the morning of surgery with an antibacterial soap.  DO NOT APPLY any lotions, deodorants, or cologne.   Do not wear jewelry Do not bring valuables to the hospital. [facility] is not responsible for valuables/personal belongings. Put on clean/comfortable clothes.  Please brush your teeth.  Ask your nurse before applying any prescription medications to the skin.

## 2024-08-19 ENCOUNTER — Encounter (HOSPITAL_COMMUNITY): Payer: Self-pay

## 2024-08-19 ENCOUNTER — Other Ambulatory Visit: Payer: Self-pay

## 2024-08-19 ENCOUNTER — Encounter (HOSPITAL_COMMUNITY)
Admission: RE | Admit: 2024-08-19 | Discharge: 2024-08-19 | Disposition: A | Source: Ambulatory Visit | Attending: Otolaryngology

## 2024-08-19 VITALS — BP 146/65 | HR 76 | Temp 97.8°F | Resp 20 | Ht 66.0 in | Wt 172.4 lb

## 2024-08-19 DIAGNOSIS — E1140 Type 2 diabetes mellitus with diabetic neuropathy, unspecified: Secondary | ICD-10-CM | POA: Diagnosis not present

## 2024-08-19 DIAGNOSIS — Z9889 Other specified postprocedural states: Secondary | ICD-10-CM | POA: Diagnosis not present

## 2024-08-19 DIAGNOSIS — L409 Psoriasis, unspecified: Secondary | ICD-10-CM | POA: Diagnosis not present

## 2024-08-19 DIAGNOSIS — I10 Essential (primary) hypertension: Secondary | ICD-10-CM | POA: Insufficient documentation

## 2024-08-19 DIAGNOSIS — Z01818 Encounter for other preprocedural examination: Secondary | ICD-10-CM | POA: Diagnosis present

## 2024-08-19 DIAGNOSIS — Y249XXS Unspecified firearm discharge, undetermined intent, sequela: Secondary | ICD-10-CM | POA: Diagnosis not present

## 2024-08-19 DIAGNOSIS — Z7984 Long term (current) use of oral hypoglycemic drugs: Secondary | ICD-10-CM | POA: Insufficient documentation

## 2024-08-19 DIAGNOSIS — Z01812 Encounter for preprocedural laboratory examination: Secondary | ICD-10-CM | POA: Insufficient documentation

## 2024-08-19 DIAGNOSIS — S0562XS Penetrating wound without foreign body of left eyeball, sequela: Secondary | ICD-10-CM | POA: Diagnosis not present

## 2024-08-19 DIAGNOSIS — E785 Hyperlipidemia, unspecified: Secondary | ICD-10-CM | POA: Diagnosis not present

## 2024-08-19 DIAGNOSIS — C44329 Squamous cell carcinoma of skin of other parts of face: Secondary | ICD-10-CM | POA: Diagnosis not present

## 2024-08-19 DIAGNOSIS — R918 Other nonspecific abnormal finding of lung field: Secondary | ICD-10-CM | POA: Insufficient documentation

## 2024-08-19 HISTORY — DX: Essential (primary) hypertension: I10

## 2024-08-19 LAB — CBC
HCT: 41.6 % (ref 39.0–52.0)
Hemoglobin: 13.6 g/dL (ref 13.0–17.0)
MCH: 30.2 pg (ref 26.0–34.0)
MCHC: 32.7 g/dL (ref 30.0–36.0)
MCV: 92.2 fL (ref 80.0–100.0)
Platelets: 187 K/uL (ref 150–400)
RBC: 4.51 MIL/uL (ref 4.22–5.81)
RDW: 13.3 % (ref 11.5–15.5)
WBC: 7.4 K/uL (ref 4.0–10.5)
nRBC: 0 % (ref 0.0–0.2)

## 2024-08-19 LAB — BASIC METABOLIC PANEL WITH GFR
Anion gap: 7 (ref 5–15)
BUN: 11 mg/dL (ref 8–23)
CO2: 26 mmol/L (ref 22–32)
Calcium: 8.9 mg/dL (ref 8.9–10.3)
Chloride: 106 mmol/L (ref 98–111)
Creatinine, Ser: 1.39 mg/dL — ABNORMAL HIGH (ref 0.61–1.24)
GFR, Estimated: 50 mL/min — ABNORMAL LOW (ref >60)
Glucose, Bld: 106 mg/dL — ABNORMAL HIGH (ref 70–99)
Potassium: 4 mmol/L (ref 3.5–5.1)
Sodium: 139 mmol/L (ref 135–145)

## 2024-08-19 LAB — HEMOGLOBIN A1C
Hgb A1c MFr Bld: 6.5 % — ABNORMAL HIGH (ref 4.8–5.6)
Mean Plasma Glucose: 139.85 mg/dL

## 2024-08-19 LAB — GLUCOSE, CAPILLARY: Glucose-Capillary: 89 mg/dL (ref 70–99)

## 2024-08-19 NOTE — Progress Notes (Signed)
 PCP - [facility] CRANFORD, TONYA  Cardiologist -   PPM/ICD - denies Device Orders - n/a Rep Notified - n/a  Chest x-ray - denies EKG - 08-19-24 Stress Test - denies ECHO - denies Cardiac Cath - denies  Sleep Study - denies CPAP - n/a  Fasting Blood Sugar - Per patient blood sugars ranges around 90. Blood sugar at PAT appointment 89.  Checks Blood Sugar daily LAST A1C on 03-27-24 8.5  Last dose of GLP1 agonist-  MOUNJARO 08-11-24   Blood Thinner Instructions:denies Aspirin Instructions:denies  ERAS Protcol -clear liquids until 9:00 am   COVID TEST- n/a   Anesthesia review: yes  Patient denies shortness of breath, fever, cough and chest pain at PAT appointment   All instructions explained to the patient, with a verbal understanding of the material. Patient agrees to go over the instructions while at home for a better understanding. Patient also instructed to self quarantine after being tested for COVID-19. The opportunity to ask questions was provided.
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
## 2024-08-20 NOTE — Anesthesia Preprocedure Evaluation (Addendum)
 Anesthesia Evaluation  Patient identified by MRN, date of birth, ID band Patient awake    Reviewed: Allergy & Precautions, NPO status , Patient's Chart, lab work & pertinent test results  History of Anesthesia Complications Negative for: history of anesthetic complications  Airway Mallampati: II  TM Distance: >3 FB Neck ROM: Full    Dental  (+) Teeth Intact, Dental Advisory Given   Pulmonary neg pulmonary ROS   breath sounds clear to auscultation       Cardiovascular hypertension, Pt. on medications  Rhythm:Regular Rate:Normal     Neuro/Psych negative neurological ROS     GI/Hepatic negative GI ROS, Neg liver ROS,,,  Endo/Other  diabetes, Type 2    Renal/GU negative Renal ROS     Musculoskeletal   Abdominal   Peds  Hematology negative hematology ROS (+)   Anesthesia Other Findings   Reproductive/Obstetrics                              Anesthesia Physical Anesthesia Plan  ASA: 3  Anesthesia Plan: General   Post-op Pain Management:    Induction: Intravenous  PONV Risk Score and Plan: 1 and Ondansetron , Dexamethasone  and Treatment may vary due to age or medical condition  Airway Management Planned: Oral ETT  Additional Equipment: None  Intra-op Plan:   Post-operative Plan: Extubation in OR  Informed Consent: I have reviewed the patients History and Physical, chart, labs and discussed the procedure including the risks, benefits and alternatives for the proposed anesthesia with the patient or authorized representative who has indicated his/her understanding and acceptance.     Dental advisory given  Plan Discussed with: CRNA  Anesthesia Plan Comments: (PAT note written 08/20/2024 by Allison Zelenak, PA-C.  )         Anesthesia Quick Evaluation

## 2024-08-23 ENCOUNTER — Encounter (HOSPITAL_COMMUNITY): Payer: Self-pay | Admitting: Otolaryngology

## 2024-08-23 ENCOUNTER — Ambulatory Visit (HOSPITAL_COMMUNITY): Admitting: Anesthesiology

## 2024-08-23 ENCOUNTER — Other Ambulatory Visit: Payer: Self-pay

## 2024-08-23 ENCOUNTER — Ambulatory Visit (HOSPITAL_COMMUNITY): Payer: Self-pay | Admitting: Vascular Surgery

## 2024-08-23 ENCOUNTER — Encounter (HOSPITAL_COMMUNITY)
Admission: RE | Disposition: A | Discharge status: Home / Self Care | Payer: Self-pay | Source: Home / Self Care | Attending: Otolaryngology

## 2024-08-23 ENCOUNTER — Ambulatory Visit (HOSPITAL_COMMUNITY)
Admission: RE | Admit: 2024-08-23 | Discharge: 2024-08-23 | Disposition: A | Discharge status: Home / Self Care | Attending: Otolaryngology | Admitting: Otolaryngology

## 2024-08-23 DIAGNOSIS — M952 Other acquired deformity of head: Secondary | ICD-10-CM | POA: Insufficient documentation

## 2024-08-23 DIAGNOSIS — C44320 Squamous cell carcinoma of skin of unspecified parts of face: Secondary | ICD-10-CM

## 2024-08-23 DIAGNOSIS — Z7984 Long term (current) use of oral hypoglycemic drugs: Secondary | ICD-10-CM | POA: Diagnosis not present

## 2024-08-23 DIAGNOSIS — E119 Type 2 diabetes mellitus without complications: Secondary | ICD-10-CM | POA: Insufficient documentation

## 2024-08-23 DIAGNOSIS — Z79899 Other long term (current) drug therapy: Secondary | ICD-10-CM | POA: Insufficient documentation

## 2024-08-23 DIAGNOSIS — I10 Essential (primary) hypertension: Secondary | ICD-10-CM | POA: Diagnosis not present

## 2024-08-23 DIAGNOSIS — Z7985 Long-term (current) use of injectable non-insulin antidiabetic drugs: Secondary | ICD-10-CM | POA: Insufficient documentation

## 2024-08-23 DIAGNOSIS — C44329 Squamous cell carcinoma of skin of other parts of face: Secondary | ICD-10-CM | POA: Diagnosis not present

## 2024-08-23 HISTORY — PX: EXCISION MASS HEAD: SHX6702

## 2024-08-23 LAB — GLUCOSE, CAPILLARY
Glucose-Capillary: 112 mg/dL — ABNORMAL HIGH (ref 70–99)
Glucose-Capillary: 118 mg/dL — ABNORMAL HIGH (ref 70–99)
Glucose-Capillary: 163 mg/dL — ABNORMAL HIGH (ref 70–99)

## 2024-08-23 SURGERY — EXCISION, MASS, HEAD
Anesthesia: General | Laterality: Right

## 2024-08-23 MED ORDER — LIDOCAINE 2% (20 MG/ML) 5 ML SYRINGE
INTRAMUSCULAR | Status: DC | PRN
Start: 2024-08-23 — End: 2024-08-23
  Administered 2024-08-23: 60 mg via INTRAVENOUS

## 2024-08-23 MED ORDER — BACITRACIN ZINC 500 UNIT/GM EX OINT
TOPICAL_OINTMENT | CUTANEOUS | Status: AC
Start: 2024-08-23 — End: 2024-08-23
  Filled 2024-08-23: qty 28.35

## 2024-08-23 MED ORDER — PROPOFOL 10 MG/ML IV BOLUS
INTRAVENOUS | Status: DC | PRN
  Administered 2024-08-23: 120 mg via INTRAVENOUS

## 2024-08-23 MED ORDER — SUCCINYLCHOLINE CHLORIDE 200 MG/10ML IV SOSY
PREFILLED_SYRINGE | INTRAVENOUS | Status: DC | PRN
  Administered 2024-08-23: 120 mg via INTRAVENOUS

## 2024-08-23 MED ORDER — CEFAZOLIN SODIUM-DEXTROSE 2-4 GM/100ML-% IV SOLN
2.0000 g | INTRAVENOUS | Status: AC
  Administered 2024-08-23: 2 g via INTRAVENOUS
  Filled 2024-08-23: qty 100

## 2024-08-23 MED ORDER — FENTANYL CITRATE (PF) 100 MCG/2ML IJ SOLN
INTRAMUSCULAR | Status: AC
  Filled 2024-08-23: qty 2

## 2024-08-23 MED ORDER — HYDROCODONE-ACETAMINOPHEN 5-325 MG PO TABS: 1.0000 | ORAL_TABLET | Freq: Four times a day (QID) | ORAL | 0 refills | Status: AC | PRN

## 2024-08-23 MED ORDER — SODIUM CHLORIDE (PF) 0.9 % IJ SOLN
INTRAMUSCULAR | Status: DC | PRN
  Administered 2024-08-23: 100 mL via INTRAVENOUS

## 2024-08-23 MED ORDER — LACTATED RINGERS IV SOLN: INTRAVENOUS | Status: DC

## 2024-08-23 MED ORDER — DEXAMETHASONE SOD PHOSPHATE PF 10 MG/ML IJ SOLN
INTRAMUSCULAR | Status: DC | PRN
Start: 2024-08-23 — End: 2024-08-23
  Administered 2024-08-23: 5 mg via INTRAVENOUS

## 2024-08-23 MED ORDER — ORAL CARE MOUTH RINSE: 15.0000 mL | Freq: Once | OROMUCOSAL | Status: AC

## 2024-08-23 MED ORDER — ALBUMIN HUMAN 5 % IV SOLN: INTRAVENOUS | Status: DC | PRN

## 2024-08-23 MED ORDER — EPINEPHRINE PF 1 MG/ML IJ SOLN
INTRAMUSCULAR | Status: DC | PRN
  Administered 2024-08-23: 1 mL

## 2024-08-23 MED ORDER — LACTATED RINGERS IV SOLN: INTRAVENOUS | Status: DC | PRN

## 2024-08-23 MED ORDER — INSULIN ASPART 100 UNIT/ML IJ SOLN: 0.0000 [IU] | INTRAMUSCULAR | Status: DC | PRN

## 2024-08-23 MED ORDER — HYDROMORPHONE HCL 1 MG/ML IJ SOLN
INTRAMUSCULAR | Status: DC | PRN
  Administered 2024-08-23: .5 mg via INTRAVENOUS

## 2024-08-23 MED ORDER — SODIUM CHLORIDE 0.9 % IV SOLN
0.1500 ug/kg/min | INTRAVENOUS | Status: AC
  Administered 2024-08-23: .05 ug/kg/min via INTRAVENOUS
  Filled 2024-08-23: qty 2000

## 2024-08-23 MED ORDER — EPINEPHRINE PF 1 MG/ML IJ SOLN
INTRAMUSCULAR | Status: AC
  Filled 2024-08-23: qty 1

## 2024-08-23 MED ORDER — LIDOCAINE-EPINEPHRINE 1 %-1:100000 IJ SOLN
INTRAMUSCULAR | Status: AC
  Filled 2024-08-23: qty 1

## 2024-08-23 MED ORDER — 0.9 % SODIUM CHLORIDE (POUR BTL) OPTIME
TOPICAL | Status: DC | PRN
  Administered 2024-08-23: 1000 mL

## 2024-08-23 MED ORDER — HYDROMORPHONE HCL 1 MG/ML IJ SOLN
INTRAMUSCULAR | Status: AC
  Filled 2024-08-23: qty 0.5

## 2024-08-23 MED ORDER — CHLORHEXIDINE GLUCONATE 0.12 % MT SOLN
15.0000 mL | Freq: Once | OROMUCOSAL | Status: AC
Start: 2024-08-23 — End: 2024-08-23
  Administered 2024-08-23: 15 mL via OROMUCOSAL
  Filled 2024-08-23: qty 15

## 2024-08-23 MED ORDER — EPINEPHRINE HCL (NASAL) 0.1 % NA SOLN
NASAL | Status: AC
  Filled 2024-08-23: qty 60

## 2024-08-23 MED ORDER — ONDANSETRON HCL 4 MG/2ML IJ SOLN
INTRAMUSCULAR | Status: DC | PRN
Start: 2024-08-23 — End: 2024-08-23
  Administered 2024-08-23: 4 mg via INTRAVENOUS

## 2024-08-23 MED ORDER — PHENYLEPHRINE 80 MCG/ML (10ML) SYRINGE FOR IV PUSH (FOR BLOOD PRESSURE SUPPORT)
PREFILLED_SYRINGE | INTRAVENOUS | Status: DC | PRN
  Administered 2024-08-23: 160 ug via INTRAVENOUS

## 2024-08-23 MED ORDER — BACITRACIN ZINC 500 UNIT/GM EX OINT
TOPICAL_OINTMENT | CUTANEOUS | Status: DC | PRN
  Administered 2024-08-23: 1 via TOPICAL

## 2024-08-23 SURGICAL SUPPLY — 62 items
BAG COUNTER SPONGE SURGICOUNT (BAG) ×2 IMPLANT
BLADE SURG 12 STRL SS (BLADE) IMPLANT
BLADE SURG 15 STRL LF DISP TIS (BLADE) ×2 IMPLANT
CANISTER SUCTION 3000ML PPV (SUCTIONS) ×2 IMPLANT
CLEANER TIP ELECTROSURG 2X2 (MISCELLANEOUS) ×2 IMPLANT
CLIP TI MEDIUM 24 (CLIP) ×2 IMPLANT
CLIP TI WIDE RED SMALL 24 (CLIP) ×2 IMPLANT
CORD BIPOLAR FORCEPS 12FT (ELECTRODE) ×2 IMPLANT
COVER SURGICAL LIGHT HANDLE (MISCELLANEOUS) ×2 IMPLANT
DRAIN 1/8 RD END PERF LFSIL ST (DRAIN) IMPLANT
DRAIN CHANNEL 19F RND (DRAIN) IMPLANT
DRAPE INCISE 13X13 STRL (DRAPES) IMPLANT
DRAPE INCISE 23X17 STRL (DRAPES) ×2 IMPLANT
DRAPE UTILITY XL STRL (DRAPES) IMPLANT
DRSG TEGADERM 2-3/8X2-3/4 SM (GAUZE/BANDAGES/DRESSINGS) ×4 IMPLANT
DRSG TEGADERM 4X4.75 (GAUZE/BANDAGES/DRESSINGS) IMPLANT
DRSG TELFA 3X8 NADH STRL (GAUZE/BANDAGES/DRESSINGS) IMPLANT
ELECT COATED BLADE 2.86 ST (ELECTRODE) ×2 IMPLANT
ELECTRODE PAIRED SUBDERMAL (MISCELLANEOUS) ×2 IMPLANT
ELECTRODE REM PT RTRN 9FT ADLT (ELECTROSURGICAL) ×2 IMPLANT
EVACUATOR SILICONE 100CC (DRAIN) IMPLANT
FORCEPS BIPOLAR SPETZLER 8 1.0 (NEUROSURGERY SUPPLIES) IMPLANT
GAUZE 4X4 16PLY ~~LOC~~+RFID DBL (SPONGE) IMPLANT
GLOVE BIO SURGEON STRL SZ7.5 (GLOVE) ×2 IMPLANT
GLOVE BIOGEL PI IND STRL 8 (GLOVE) ×2 IMPLANT
GOWN STRL REUS W/ TWL LRG LVL3 (GOWN DISPOSABLE) ×2 IMPLANT
GOWN STRL REUS W/ TWL XL LVL3 (GOWN DISPOSABLE) ×2 IMPLANT
KIT BASIN OR (CUSTOM PROCEDURE TRAY) ×2 IMPLANT
KIT TURNOVER KIT B (KITS) ×2 IMPLANT
LOCATOR NERVE 3 VOLT (DISPOSABLE) IMPLANT
NDL FILTER BLUNT 18X1 1/2 (NEEDLE) ×2 IMPLANT
NDL PRECISIONGLIDE 27X1.5 (NEEDLE) ×2 IMPLANT
NEEDLE FILTER BLUNT 18X1 1/2 (NEEDLE) ×1 IMPLANT
NEEDLE PRECISIONGLIDE 27X1.5 (NEEDLE) ×1 IMPLANT
PAD ARMBOARD POSITIONER FOAM (MISCELLANEOUS) ×4 IMPLANT
PAD MAGNETIC INSTR ST 16X20 (MISCELLANEOUS) IMPLANT
PATTIES SURGICAL .5 X3 (DISPOSABLE) IMPLANT
PENCIL SMOKE EVACUATOR (MISCELLANEOUS) ×2 IMPLANT
POSITIONER HEAD DONUT 9IN (MISCELLANEOUS) ×2 IMPLANT
PROBE NERVBE PRASS .33 (MISCELLANEOUS) ×2 IMPLANT
RETRACTOR STAY HOOK 5MM (MISCELLANEOUS) ×2 IMPLANT
SET WALTER ACTIVATION W/DRAPE (SET/KITS/TRAYS/PACK) IMPLANT
SOLN 0.9% NACL POUR BTL 1000ML (IV SOLUTION) ×2 IMPLANT
SOLN STERILE WATER BTL 1000 ML (IV SOLUTION) ×2 IMPLANT
SPONGE INTESTINAL PEANUT (DISPOSABLE) ×2 IMPLANT
SPONGE T-LAP 18X18 ~~LOC~~+RFID (SPONGE) ×2 IMPLANT
STAPLER SKIN PROX 35W (STAPLE) ×2 IMPLANT
SUT ETHILON 3 0 PS 1 (SUTURE) IMPLANT
SUT ETHILON 5 0 PS 2 18 (SUTURE) ×2 IMPLANT
SUT ETHILON 6 0 9-3 1X18 BLK (SUTURE) IMPLANT
SUT SILK 2 0 SH CR/8 (SUTURE) ×2 IMPLANT
SUT SILK 3 0 REEL (SUTURE) ×2 IMPLANT
SUT SILK 3 0 SH 30 (SUTURE) IMPLANT
SUT SILK 3 0 SH CR/8 (SUTURE) ×2 IMPLANT
SUT SILK 4 0 REEL (SUTURE) IMPLANT
SUT VIC AB 3-0 SH 8-18 (SUTURE) ×2 IMPLANT
SUT VIC AB 4-0 PS2 18 (SUTURE) ×4 IMPLANT
SUT VIC AB 4-0 SH 18 (SUTURE) IMPLANT
SYR TB 1ML LUER SLIP (SYRINGE) ×2 IMPLANT
TOWEL GREEN STERILE FF (TOWEL DISPOSABLE) ×2 IMPLANT
TRAY ENT MC OR (CUSTOM PROCEDURE TRAY) ×2 IMPLANT
TRAY FOLEY MTR SLVR 14FR STAT (SET/KITS/TRAYS/PACK) IMPLANT

## 2024-08-23 NOTE — Anesthesia Procedure Notes (Signed)
 Procedure Name: Intubation Date/Time: 08/23/2024 12:19 PM  Performed by: Scherrie Mast, CRNAPre-anesthesia Checklist: Patient identified, Emergency Drugs available, Suction available and Patient being monitored Patient Re-evaluated:Patient Re-evaluated prior to induction Oxygen Delivery Method: Circle System Utilized Preoxygenation: Pre-oxygenation with 100% oxygen Induction Type: IV induction Ventilation: Mask ventilation without difficulty Laryngoscope Size: Mac and 4 Grade View: Grade I Tube type: Oral Tube size: 7.0 mm Number of attempts: 1 Airway Equipment and Method: Stylet and Oral airway Placement Confirmation: ETT inserted through vocal cords under direct vision, positive ETCO2 and breath sounds checked- equal and bilateral Secured at: 22 cm Tube secured with: Tape Dental Injury: Teeth and Oropharynx as per pre-operative assessment
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
## 2024-08-23 NOTE — Discharge Instructions (Signed)

## 2024-08-23 NOTE — Op Note (Signed)
 OPERATIVE NOTE  Tony Ramirez Date/Time of Admission: 08/23/2024 10:03 AM  CSN: [id number];FMW:[id number] Attending Provider: Luciano Standing, MD Room/Bed: MCPO/NONE DOB: 27-Aug-1941 Age: 83 y.o.   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; Acquired deformity of face  Post-Op Diagnosis: Squamous cell carcinoma of skin of right cheek; Acquired deformity of face  Procedure: Wide local excision malignant neoplasm of right face >4cm (CPT 11646) Adjacent tissue transfer face 48cm2 (CPT 14301)   Anesthesia: General  Surgeon(s): Standing KANDICE Luciano, MD  Staff: Circulator: Veda Verla SAILOR, RN Relief Circulator: Mort Warren RAMAN, RN Scrub Person: Perez-Vasquez, Tiffany RN First Assistant: Sherrine Moats, RN  Implants: * No implants in log *  Specimens: ID Type Source Tests Collected by Time Destination  1 : wide local excision squamous cell carcinoma right face Tissue PATH ENT excision SURGICAL PATHOLOGY Luciano Standing, MD 08/23/2024 1322   2 : wide local excision margin 12-1:30 Tissue PATH ENT excision SURGICAL PATHOLOGY Luciano Standing, MD 08/23/2024 1334   3 : wide local excision margin 1:30-3 Tissue PATH ENT excision SURGICAL PATHOLOGY Luciano Standing, MD 08/23/2024 1335   4 : wide local excision margin 3-4:30 Tissue PATH ENT excision SURGICAL PATHOLOGY Luciano Standing, MD 08/23/2024 1335   5 : wide local excision margin 4:30-6 Tissue PATH ENT excision SURGICAL PATHOLOGY Luciano Standing, MD 08/23/2024 1335   6 : wide local excision margin 6-7:30 Tissue PATH ENT excision SURGICAL PATHOLOGY Luciano Standing, MD 08/23/2024 1335   7 : wide local excision margin 7:30-9 Tissue PATH ENT excision SURGICAL PATHOLOGY Luciano Standing, MD 08/23/2024 1335   8 : wide local excision margin 9-10:30 Tissue PATH ENT excision SURGICAL PATHOLOGY Luciano Standing, MD 08/23/2024 1336   9 : wide local excision margin 10:30-12 Tissue PATH ENT excision SURGICAL PATHOLOGY Luciano Standing, MD  08/23/2024 1336   10 : wide local excision margin deep Tissue PATH ENT excision SURGICAL PATHOLOGY Luciano Standing, MD 08/23/2024 1336     Complications: none  EBL: 50 ML  IVF: Per anesthesia ML  Condition: stable  Operative Findings:  3cm SCC of the right face parotid region excised to negative margins with cuff of parotid tissue. Resultant 5x6cm circular diameter defect repaired with cervicofacial rotational advancement flap   Haiku Photos:   Pre-Excision   Post-Repair    Description of Operation:  The patient was identified in the preoperative area and consent confirmed in the chart.  He was brought to the operating room by anesthesia and a preoperative timeout was performed confirming the patient's identity and procedure to be performed.  Once all were in agreement we proceeded with surgery.  General anesthesia was induced and the patient was intubated with an oral endotracheal tube.  The tube was secured.  The patient's head of bed was turned 180 degrees away from anesthesia.  The patient's head was turned to the left exposing the right face and neck.  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  This was marked out with 1 cm margins for wide local excision.  The facial nerve monitor was attached to the orbicularis oculi muscles in the frontal and zygomatic region as I anticipated possible encountering of these nerves during dissection.  Was attached to the nerve monitoring system and used for the duration of the procedure.  A cervicofacial rotational advancement flap was then marked out extending from the wide local excision site in the preauricular crease into the lateral neck and then extending anteriorly into  the transverse neck crease.  The face was anesthetized with 1 100,000 epinephrine  without lidocaine  due to facial nerve monitoring.  Patient was then prepped and draped in standard sterile fashion for a  procedure of this kind.  A final preoperative pause was performed and we proceeded with surgery.  I began with wide local excision malignant neoplasm of the right face greater than 4 cm. A 15 blade was used to incise the skin and subcutaneous tissue.  Due to my concern for close proximity to the parotid gland I brought the depth of the dissection down to the parotidomasseteric fascia.  I circumferentially sharply dissected out the tumor.  Centrally below the tumor there was irregular adherence to the fascia therefore a cuff of parotid tissue was carefully resected with the margin for further margin control.  The facial nerve monitor was used for the duration of this time and facial nerve branches were not encountered.  There was no adverse events on the monitor.  Once the tumor was excised with a cuff of parotid tissue the specimen was oriented and sent for permanent pathology.  Next, circumferential and deep margins were taken all negative for malignancy.  Hemostasis was controlled with bipolar cautery and surgical clips.  The resultant defect after margins was approximately 5 x 6 cm circular diameter defect (~24cm2) of the right parotid region.  To repair this a large cervicofacial rotational advancement flap was indicated.  The 15 blade was used to incise the preauricular crease from the defect into the lateral neck and then into a transverse neck rhytid lower - mid neck.  A subplatysmal/sub-SMAS plane was sharply dissected forward to preserve thickness and blood supply to the flap.  The cervical branch of platysma muscle was divided.  The greater auricular nerve and external jugular vein were identified and preserved during the dissection.  The flap was raised approximately 10 x 12 cm.  The flap was advanced superiorly into the parotid facial defect and inset with buried interrupted 3-0 Vicryl sutures and a combination of interrupted 5-0 nylon and running 6-0 nylon.  This resulted in a secondary defect of  the neck approximately 8 x 3 cm or 24 cm.  Extensive dissection and a subplatysmal plane was performed inferiorly and laterally in the neck with the bovie to allow for advancement of the inferior neck flap to close the secondary defect site.  The total combination of the primary defect and second defect was approximately 48 cm. The wounds were copiously irrigated with sterile saline and Valsalva performed confirming hemostasis. The rest of the flap was closed with buried interrupted 3-0 Vicryl sutures and running 5-0 nylon.  Prior to final closure a 74 French JP drain was secured into the neck with a 3-0 silk suture.    The wounds were cleansed and dressed with bacitracin , Telfa and valvular tape.  Patient was then turned back to anesthesia extubated and brought to the recovery room in stable condition.  Patient will be discharged from PACU after appropriate instructions were provided to family for drain care over the weekend.  He will follow-up in the office next week.   Elspeth KANDICE Coddington, MD [facility] ENT  08/23/2024

## 2024-08-23 NOTE — Transfer of Care (Signed)
 Immediate Anesthesia Transfer of Care Note  Patient: Tony Ramirez  Procedure(s) Performed: EXCISION, MASS, HEAD (Right)  Patient Location: PACU  Anesthesia Type:General  Level of Consciousness: drowsy, patient cooperative, and responds to stimulation  Airway & Oxygen Therapy: Patient Spontanous Breathing and Patient connected to face mask oxygen  Post-op Assessment: Report given to RN and Post -op Vital signs reviewed and stable  Post vital signs: Reviewed and stable  Last Vitals:  Vitals Value Taken Time  BP 128/63 08/23/24 15:34  Temp 37.1 C 08/23/24 15:34  Pulse 97 08/23/24 15:36  Resp 19 08/23/24 15:36  SpO2 90 % 08/23/24 15:36  Vitals shown include unfiled device data.  Last Pain:  Vitals:   08/23/24 1534  TempSrc:   PainSc: Asleep      Patients Stated Pain Goal: 0 (08/23/24 1028)  Complications: No notable events documented.

## 2024-08-26 ENCOUNTER — Encounter (HOSPITAL_COMMUNITY): Payer: Self-pay | Admitting: Otolaryngology

## 2024-08-27 DIAGNOSIS — C44329 Squamous cell carcinoma of skin of other parts of face: Secondary | ICD-10-CM | POA: Diagnosis not present

## 2024-08-27 DIAGNOSIS — M952 Other acquired deformity of head: Secondary | ICD-10-CM | POA: Diagnosis not present

## 2024-08-27 LAB — SURGICAL PATHOLOGY

## 2024-08-27 NOTE — Anesthesia Postprocedure Evaluation (Signed)
 Anesthesia Post Note  Patient: Tony Ramirez  Procedure(s) Performed: EXCISION, MASS, HEAD (Right)     Patient location during evaluation: PACU Anesthesia Type: General Level of consciousness: awake Pain management: pain level controlled Vital Signs Assessment: post-procedure vital signs reviewed and stable Respiratory status: spontaneous breathing Cardiovascular status: blood pressure returned to baseline Postop Assessment: no apparent nausea or vomiting Anesthetic complications: no   No notable events documented.  Last Vitals:  Vitals:   08/23/24 1600 08/23/24 1615  BP: (!) 120/55 129/69  Pulse: 93 92  Resp: 12 14  Temp:  37.1 C  SpO2: 95% 100%    Last Pain:  Vitals:   08/23/24 1534  TempSrc:   PainSc: Asleep                 Lauraine KATHEE [place]

## 2024-08-27 NOTE — Addendum Note (Signed)
 Addendum  created 08/27/24 2123 by Epifanio Charleston, MD   Clinical Note Signed

## 2024-08-28 NOTE — Addendum Note (Signed)
 Addendum  created 08/28/24 0848 by Waddell Lauraine NOVAK, MD   Attestation recorded in Intraprocedure, Intraprocedure Attestations filed

## 2024-09-03 DIAGNOSIS — C44329 Squamous cell carcinoma of skin of other parts of face: Secondary | ICD-10-CM | POA: Diagnosis not present

## 2024-09-03 DIAGNOSIS — M952 Other acquired deformity of head: Secondary | ICD-10-CM | POA: Diagnosis not present

## 2024-09-04 ENCOUNTER — Other Ambulatory Visit: Payer: Self-pay

## 2024-09-04 DIAGNOSIS — C44320 Squamous cell carcinoma of skin of unspecified parts of face: Secondary | ICD-10-CM

## 2024-09-16 ENCOUNTER — Telehealth: Payer: Self-pay | Admitting: Radiation Oncology

## 2024-09-16 NOTE — Telephone Encounter (Signed)
 Pt's grandson called requesting appts be cx with Dr. Izell. Darden was advised pt does not want radiation and repeated that they were advised by Dr. Luciano that no additional treatment was needed. I re-iterated the 12/3 message our nurse navigator received directly from Dr. Luciano requesting RadOnc consult. Grandson still refused consult and advised pt is not interested at this time. I stated referral would be closed and recommended they contact Dr. Vella office to advise pt is not interested in XRT; grandson stated they have a f/u soon and will be advising him then.

## 2024-09-16 NOTE — Telephone Encounter (Signed)
 Spoke with pt's daughter who agreed to appt date/time. She advised she pt may not agree to appt but took down information to c/b if needed. She advised she would explain reason for this appt and is hoping pt agrees.

## 2024-09-18 ENCOUNTER — Ambulatory Visit: Admitting: Radiation Oncology

## 2024-09-18 ENCOUNTER — Ambulatory Visit

## 2024-09-20 NOTE — Progress Notes (Signed)
 Oncology Nurse Navigator Documentation   Mr. Tony Ramirez was referred to radiation oncology to consider radiation treatment for the Tony Ramirez to his face. He declined consult when our schedulers called to set up an appointment. He was discussed in our 12/17 ENT conference and radiation was still recommended. Dr. Luciano called and spoke to his grandson, Tony Ramirez about the recommendation and he was going to speak to Mr. Tony Ramirez. I called Tony Ramirez just now and he tells me that Mr. Tony Ramirez is still considering if he will agree to a consult. I have provided Tony Ramirez with my direct number and he has agreed to call me back when Tony Ramirez makes a decision regarding consult.   Tony Jefferson RN, BSN, OCN Head & Neck Oncology Nurse Navigator [facility] at [facility] Phone # [phone]  Fax # [phone]

## 2024-09-23 ENCOUNTER — Telehealth: Payer: Self-pay | Admitting: Radiation Oncology

## 2024-09-23 NOTE — Telephone Encounter (Signed)
 LVM for pt's daughter and grandson to schedule pt for consult with Dr. Izell after pt spoke with Dr. Luciano.

## 2024-10-01 NOTE — Progress Notes (Signed)
 Oncology Nurse Navigator Documentation   I spoke with Mr. Cocuzza' grandson Veda last week and they both had agreed to a consult with Dr. Izell to discuss radiation recommendations. Our schedulers called on 12/22 to attempt to schedule but needed to leave a VM. I have just called and left a voicemail with Kaleb today to again advise to call us  to set up an appointment. I left my direct contact information and asked for a return call.   Delon Jefferson RN, BSN, OCN Head & Neck Oncology Nurse Navigator [facility] at [facility] Phone # [phone]  Fax # [phone]

## 2024-10-04 ENCOUNTER — Telehealth: Payer: Self-pay | Admitting: Radiation Oncology

## 2024-10-04 NOTE — Telephone Encounter (Signed)
 LVM for pt's family to schedule consultation with Dr. Izell.

## 2024-10-08 NOTE — Progress Notes (Signed)
 Oncology Nurse Navigator Documentation   I have left a voicemail with Mr. Damron' daughter, Sueanne asking for a return call to get him scheduled for a consult to discuss radiation with Dr. Izell. I provided Sueanne with my direct number and the main number for radiation scheduling and asked for a return call as soon as possible.   Delon Jefferson RN, BSN, OCN Head & Neck Oncology Nurse Navigator [facility] at [facility] Phone # [phone]  Fax # [phone]

## 2024-10-18 NOTE — Progress Notes (Signed)
 Oncology Nurse Navigator Documentation   On chart review I learned that Mr. Sahagun saw Dr. Luciano on 10/15/24 and documented that Mr. Katzenstein has declined the option of radiation. Dr. Luciano will continue to follow up with him seeing him in 3 months. I have notified our scheduling team and Dr. Izell.  Delon Jefferson RN, BSN, OCN Head & Neck Oncology Nurse Navigator [facility] at [facility] Phone # [phone]  Fax # [phone]
